# Patient Record
Sex: Male | Born: 1985 | Race: Black or African American | Hispanic: No | Marital: Single
Health system: Midwestern US, Community
[De-identification: ages and names within clinical notes are randomized; demographics above are authoritative.]

## PROBLEM LIST (undated history)

## (undated) DIAGNOSIS — M7989 Other specified soft tissue disorders: Secondary | ICD-10-CM

## (undated) DIAGNOSIS — Z8711 Personal history of peptic ulcer disease: Secondary | ICD-10-CM

## (undated) DIAGNOSIS — Z8719 Personal history of other diseases of the digestive system: Secondary | ICD-10-CM

## (undated) HISTORY — PX: ESOPHAGOGASTRODUODENOSCOPY: SHX1529

---

## 2016-07-18 ENCOUNTER — Emergency Department: Admit: 2016-07-18 | Payer: MEDICAID

## 2016-07-18 ENCOUNTER — Inpatient Hospital Stay
Admit: 2016-07-18 | Discharge: 2016-07-19 | Disposition: A | Discharge status: Home / Self Care | Payer: MEDICAID | Attending: Emergency Medicine

## 2016-07-18 ENCOUNTER — Emergency Department

## 2016-07-18 DIAGNOSIS — M7989 Other specified soft tissue disorders: Secondary | ICD-10-CM

## 2016-07-18 LAB — CBC WITH AUTOMATED DIFF
ABS. BASOPHILS: 0 10*3/uL (ref 0.0–0.1)
ABS. EOSINOPHILS: 0.1 10*3/uL (ref 0.0–0.4)
ABS. LYMPHOCYTES: 3.6 10*3/uL — ABNORMAL HIGH (ref 0.8–3.5)
ABS. MONOCYTES: 1 10*3/uL (ref 0.0–1.0)
ABS. NEUTROPHILS: 4.5 10*3/uL (ref 1.8–8.0)
BASOPHILS: 0 % (ref 0–1)
EOSINOPHILS: 2 % (ref 0–7)
HCT: 39.6 % (ref 36.6–50.3)
HGB: 13 g/dL (ref 12.1–17.0)
LYMPHOCYTES: 39 % (ref 12–49)
MCH: 29.5 PG (ref 26.0–34.0)
MCHC: 32.8 g/dL (ref 30.0–36.5)
MCV: 89.8 FL (ref 80.0–99.0)
MONOCYTES: 10 % (ref 5–13)
NEUTROPHILS: 49 % (ref 32–75)
PLATELET: 227 10*3/uL (ref 150–400)
RBC: 4.41 M/uL (ref 4.10–5.70)
RDW: 14 % (ref 11.5–14.5)
WBC: 9.3 10*3/uL (ref 4.1–11.1)

## 2016-07-18 MED ORDER — KETOROLAC TROMETHAMINE 30 MG/ML INJECTION
30 mg/mL (1 mL) | INTRAMUSCULAR | Status: AC
Start: 2016-07-18 — End: 2016-07-18
  Administered 2016-07-18: via INTRAVENOUS

## 2016-07-18 MED FILL — KETOROLAC TROMETHAMINE 30 MG/ML INJECTION: 30 mg/mL (1 mL) | INTRAMUSCULAR | Qty: 1

## 2016-07-18 NOTE — ED Provider Notes (Signed)
HPI Comments: 30 yo AAM with medical hx remarkable for GERD presenting ambulatory to the ED with complaint of aching, 10/10, bilateral leg pain, worse with ambulating for past two months. Today noticed swelling and pain along the calf after removing socks. Pain and swelling worse in left leg. No interventions PTA. Reports episodic chest pain described as soreness, lower back pain, and hip pain. No fever, headache, chills, ear pain, sore throat, cough, SOB, diarrhea, constipation, dysuria, urgency, hematuria or urinary retention. Drives for several hours daily.     Patient is a 30 y.o. male presenting with lower extremity edema. The history is provided by the patient.   Leg Swelling    Pertinent negatives include no numbness.        Past Medical History:   Diagnosis Date   ??? GERD (gastroesophageal reflux disease)        Past Surgical History:   Procedure Laterality Date   ??? HX ENDOSCOPY           History reviewed. No pertinent family history.    Social History     Social History   ??? Marital status: SINGLE     Spouse name: N/A   ??? Number of children: N/A   ??? Years of education: N/A     Occupational History   ??? Not on file.     Social History Main Topics   ??? Smoking status: Current Some Day Smoker   ??? Smokeless tobacco: Never Used   ??? Alcohol use Yes   ??? Drug use: Yes     Special: Marijuana   ??? Sexual activity: Not on file     Other Topics Concern   ??? Not on file     Social History Narrative   ??? No narrative on file         ALLERGIES: Review of patient's allergies indicates no known allergies.    Review of Systems   Constitutional: Negative.  Negative for chills and fever.   HENT: Negative for congestion, ear pain, rhinorrhea, sore throat and voice change.    Eyes: Negative.  Negative for photophobia, pain and itching.   Respiratory: Negative for cough, chest tightness and shortness of breath.    Cardiovascular: Positive for chest pain. Negative for palpitations.    Gastrointestinal: Negative for abdominal distention, abdominal pain, constipation, diarrhea and vomiting.   Genitourinary: Negative for difficulty urinating, dysuria, frequency and urgency.   Musculoskeletal: Positive for arthralgias and joint swelling. Negative for neck stiffness.   Neurological: Negative for weakness, numbness and headaches.   Psychiatric/Behavioral: Negative for confusion and decreased concentration.   All other systems reviewed and are negative.      Vitals:    07/18/16 1820   BP: 142/89   Pulse: 97   Resp: 16   Temp: 99.3 ??F (37.4 ??C)   SpO2: 99%   Weight: 113.9 kg (251 lb)   Height: 6\' 2"  (1.88 m)            Physical Exam   Constitutional: He is oriented to person, place, and time. He appears well-developed and well-nourished. No distress.   Well appearing AAM seated in NAD   HENT:   Head: Normocephalic and atraumatic.   Right Ear: External ear normal.   Left Ear: External ear normal.   Nose: Nose normal.   Mouth/Throat: Oropharynx is clear and moist. No oropharyngeal exudate.   Eyes: Conjunctivae and EOM are normal. Pupils are equal, round, and reactive to light. Right eye exhibits no discharge.  Left eye exhibits no discharge.   Neck: Normal range of motion. Neck supple.   Cardiovascular: Normal rate, regular rhythm and normal heart sounds.    Pulmonary/Chest: Effort normal and breath sounds normal. He has no wheezes. He has no rales.   Abdominal: Soft. Bowel sounds are normal. He exhibits no distension. There is no tenderness. There is no guarding.   Musculoskeletal: Normal range of motion.        Right lower leg: Normal.        Left lower leg: Normal.        Legs:  Lymphadenopathy:     He has no cervical adenopathy.   Neurological: He is alert and oriented to person, place, and time. No cranial nerve deficit.   Skin: Skin is warm and dry. He is not diaphoretic.   Psychiatric: He has a normal mood and affect. His behavior is normal.   Nursing note and vitals reviewed.       MDM   Number of Diagnoses or Management Options  Diagnosis management comments: 30 yo AAM with complaint of bilateral leg pain x months and subjective LE edema for the past day.  ? DVT vs CHF (doubt) vs renal impairment vs venous insuf amongst others    Plan  EKG  Trop  CBC  CMP  UA      ED Course       Procedures     Progress note      EKG interpretation: (Preliminary)  Rhythm: normal sinus rhythm; and regular . Rate (approx.): 80; Axis: normal; P wave: normal; QRS interval: normal ; ST/T wave: normal; in  Leads. Prudence Heiny C [place], [place]    Labs reviewed. D dimer -. No concern for DVT. Normal renal function without protein to suggest nephrotic syndrome. LFT's normal. Suspect overuse syndrome. Emelynn Rance C [place], [place]    Patient's results have been reviewed with them.  Patient and/or family have verbally conveyed their understanding and agreement of the patient's signs, symptoms, diagnosis, treatment and prognosis and additionally agree to follow up as recommended or return to the Emergency Room should their condition change prior to follow-up.  Discharge instructions have also been provided to the patient with some educational information regarding their diagnosis as well a list of reasons why they would want to return to the ER prior to their follow-up appointment should their condition change. Mansa Willers C [place], [place]    A/P  Leg pain: Follow-up with regular doctor. Naprosyn twice daily for pain. Return for any new or owrsening. Kizzy Olafson C [place], [place]

## 2016-07-18 NOTE — ED Triage Notes (Signed)
C/o bilateral lower leg swelling x 1 week.  Also c/o mid/left chest soreness x 3 days.  Denies SOB.  +THC odor

## 2016-07-18 NOTE — ED Notes (Signed)
Pt given discharge instructions. Questions answered and pt states understanding, no distress noted, pt ambulated out of unit.

## 2016-07-18 NOTE — ED Notes (Signed)
Pt giggling in triage and having difficulty focusing on questions asked. Ambulatory with steady gait.

## 2016-07-19 LAB — URINALYSIS W/ RFLX MICROSCOPIC
Bilirubin: NEGATIVE
Blood: NEGATIVE
Glucose: NEGATIVE mg/dL
Ketone: NEGATIVE mg/dL
Nitrites: NEGATIVE
Protein: NEGATIVE mg/dL
Specific gravity: 1.023 (ref 1.003–1.030)
Urobilinogen: 0.2 EU/dL (ref 0.2–1.0)
pH (UA): 7.5 (ref 5.0–8.0)

## 2016-07-19 LAB — METABOLIC PANEL, COMPREHENSIVE
A-G Ratio: 1.1 (ref 1.1–2.2)
ALT (SGPT): 31 U/L (ref 12–78)
AST (SGOT): 22 U/L (ref 15–37)
Albumin: 4.1 g/dL (ref 3.5–5.0)
Alk. phosphatase: 72 U/L (ref 45–117)
Anion gap: 7 mmol/L (ref 5–15)
BUN/Creatinine ratio: 10 — ABNORMAL LOW (ref 12–20)
BUN: 14 MG/DL (ref 6–20)
Bilirubin, total: 0.3 MG/DL (ref 0.2–1.0)
CO2: 29 mmol/L (ref 21–32)
Calcium: 8.6 MG/DL (ref 8.5–10.1)
Chloride: 104 mmol/L (ref 97–108)
Creatinine: 1.37 MG/DL — ABNORMAL HIGH (ref 0.70–1.30)
GFR est AA: >60 mL/min/{1.73_m2} (ref >60)
GFR est non-AA: >60 mL/min/{1.73_m2} (ref >60)
Globulin: 3.8 g/dL (ref 2.0–4.0)
Glucose: 107 mg/dL — ABNORMAL HIGH (ref 65–100)
Potassium: 4.2 mmol/L (ref 3.5–5.1)
Protein, total: 7.9 g/dL (ref 6.4–8.2)
Sodium: 140 mmol/L (ref 136–145)

## 2016-07-19 LAB — EKG, 12 LEAD, INITIAL
Atrial Rate: 81 {beats}/min
Calculated P Axis: 81 degrees
Calculated R Axis: 31 degrees
Calculated T Axis: 21 degrees
Diagnosis: NORMAL
P-R Interval: 146 ms
Q-T Interval: 348 ms
QRS Duration: 96 ms
QTC Calculation (Bezet): 404 ms
Ventricular Rate: 81 {beats}/min

## 2016-07-19 LAB — TROPONIN I: Troponin-I, Qt.: <0.04 ng/mL (ref <0.05)

## 2016-07-19 LAB — D DIMER: D-dimer: <0.17 mg/L FEU (ref 0.00–0.65)

## 2016-07-19 LAB — EKG 12-LEAD
Atrial Rate: 81 {beats}/min
Diagnosis: NORMAL
P Axis: 81 degrees
P-R Interval: 146 ms
Q-T Interval: 348 ms
QRS Duration: 96 ms
QTc Calculation (Bazett): 404 ms
R Axis: 31 degrees
T Axis: 21 degrees
Ventricular Rate: 81 {beats}/min

## 2016-07-19 LAB — D-DIMER, QUANTITATIVE: D-Dimer, Quant: <0.17 mg/L FEU (ref 0.00–0.65)

## 2016-07-19 MED ORDER — NAPROXEN 500 MG TAB
500 mg | ORAL_TABLET | Freq: Two times a day (BID) | ORAL | 0 refills | Status: AC
Start: 2016-07-19 — End: 2016-07-28

## 2017-11-12 ENCOUNTER — Emergency Department (HOSPITAL_COMMUNITY)
Admission: EM | Admit: 2017-11-12 | Discharge: 2017-11-12 | Disposition: A | Discharge status: Home / Self Care | Payer: Self-pay | Attending: Emergency Medicine | Admitting: Emergency Medicine

## 2017-11-12 ENCOUNTER — Encounter (HOSPITAL_COMMUNITY): Payer: Self-pay

## 2017-11-12 DIAGNOSIS — J029 Acute pharyngitis, unspecified: Secondary | ICD-10-CM | POA: Insufficient documentation

## 2017-11-12 DIAGNOSIS — F17200 Nicotine dependence, unspecified, uncomplicated: Secondary | ICD-10-CM | POA: Insufficient documentation

## 2017-11-12 DIAGNOSIS — R61 Generalized hyperhidrosis: Secondary | ICD-10-CM | POA: Insufficient documentation

## 2017-11-12 DIAGNOSIS — R1110 Vomiting, unspecified: Secondary | ICD-10-CM | POA: Insufficient documentation

## 2017-11-12 HISTORY — DX: Personal history of peptic ulcer disease: Z87.11

## 2017-11-12 HISTORY — DX: Personal history of other diseases of the digestive system: Z87.19

## 2017-11-12 LAB — RAPID STREP SCREEN (MED CTR MEBANE ONLY): STREPTOCOCCUS, GROUP A SCREEN (DIRECT): NEGATIVE

## 2017-11-12 MED ORDER — DEXAMETHASONE SODIUM PHOSPHATE 10 MG/ML IJ SOLN
10.0000 mg | Freq: Once | INTRAMUSCULAR | Status: AC
  Administered 2017-11-12: 10 mg via INTRAMUSCULAR
  Filled 2017-11-12: qty 1

## 2017-11-12 MED ORDER — PHENOL 1.4 % MT LIQD: 1.0000 | OROMUCOSAL | 0 refills | Status: AC | PRN

## 2017-11-12 NOTE — ED Triage Notes (Signed)
Pt states that he has had a sore throat since yesterday and his neck hurts, reports swollen lymph nodes. Denies fever

## 2017-11-12 NOTE — ED Provider Notes (Signed)
[facility] EMERGENCY DEPARTMENT Provider Note   CSN: [id number] Arrival date & time: 11/12/17  2022     History   Chief Complaint Chief Complaint  Patient presents with  . Sore Throat    HPI Isaiah Ball is a 32 y.o. male presenting for evaluation of sore throat.  Patient states that yesterday, he started to develop a sore throat.  He reports associated ear discomfort bilaterally.  He denies fevers, chills, nasal congestion, cough, chest pain, nausea, vomiting, abdominal pain.  He denies trismus.  He reports his voice is more hoarse, but denies it sounding muffled.  He reports increased pain with swallowing, but is able to handle secretions without difficulty.  He denies sick contacts.  He states he has 2 school-aged children, 1 of which had strep last month, but not within the past week.  He has not tried anything for his symptoms.  Pain is bilateral.  He has no other medical problems, does not take medications daily.  HPI  Past Medical History:  Diagnosis Date  . History of stomach ulcers     There are no active problems to display for this patient.   History reviewed. No pertinent surgical history.     Home Medications    Prior to Admission medications   Medication Sig Start Date End Date Taking? Authorizing Provider  phenol (CHLORASEPTIC) 1.4 % LIQD Use as directed 1 spray in the mouth or throat as needed for throat irritation / pain. 11/12/17   Honey Zakarian, PA-C    Family History No family history on file.  Social History Social History   Tobacco Use  . Smoking status: Current Every Day Smoker  . Smokeless tobacco: Never Used  Substance Use Topics  . Alcohol use: No    Frequency: Never  . Drug use: No     Allergies   Patient has no known allergies.   Review of Systems Review of Systems  Constitutional: Negative for chills and fever.  HENT: Positive for sore throat. Negative for congestion and rhinorrhea.       Physical Exam Updated Vital Signs BP 123/67 (BP Location: Right Arm)   Pulse 98   Temp 98.8 F (37.1 C) (Oral)   Resp 18   Ht 6\' 2"  (1.88 m)   Wt 108.9 kg (240 lb)   SpO2 99%   BMI 30.81 kg/m   Physical Exam  Constitutional: He is oriented to person, place, and time. He appears well-developed and well-nourished. No distress.  HENT:  Head: Normocephalic and atraumatic.  Right Ear: Tympanic membrane, external ear and ear canal normal.  Left Ear: Tympanic membrane, external ear and ear canal normal.  Nose: Nose normal. Right sinus exhibits no maxillary sinus tenderness and no frontal sinus tenderness. Left sinus exhibits no maxillary sinus tenderness and no frontal sinus tenderness.  Mouth/Throat: Uvula is midline and mucous membranes are normal. Posterior oropharyngeal erythema present. No oropharyngeal exudate, posterior oropharyngeal edema or tonsillar abscesses. Tonsils are 1+ on the right. Tonsils are 1+ on the left. No tonsillar exudate.  OP erythematous with mild bilateral tonsillar swelling.  No exudate noted.  No trismus.  Uvula midline with equal palate rise.  No obvious abscess.  Handling secretions easily.  Eyes: Conjunctivae and EOM are normal. Pupils are equal, round, and reactive to light.  Neck: Normal range of motion.  Cardiovascular: Normal rate, regular rhythm and intact distal pulses.  Pulmonary/Chest: Effort normal and breath sounds normal. He has no decreased breath sounds. He  has no wheezes. He has no rhonchi. He has no rales.  Pt speaking in full sentences without difficulty. Clear lung sounds in all fields  Abdominal: Soft. He exhibits no distension. There is no tenderness.  Musculoskeletal: Normal range of motion.  Lymphadenopathy:    He has cervical adenopathy.  Neurological: He is alert and oriented to person, place, and time.  Skin: Skin is warm.  Psychiatric: He has a normal mood and affect.  Nursing note and vitals reviewed.    ED Treatments /  Results  Labs (all labs ordered are listed, but only abnormal results are displayed) Labs Reviewed  RAPID STREP SCREEN (NOT AT [facility])  CULTURE, GROUP A STREP [facility])    EKG  EKG Interpretation None       Radiology No results found.  Procedures Procedures (including critical care time)  Medications Ordered in ED Medications  dexamethasone (DECADRON) injection 10 mg (10 mg Intramuscular Given 11/12/17 2102)     Initial Impression / Assessment and Plan / ED Course  I have reviewed the triage vital signs and the nursing notes.  Pertinent labs & imaging results that were available during my care of the patient were reviewed by me and considered in my medical decision making (see chart for details).     Pt presenting for evaluation of sore throat.  Physical exam reassuring, he is afebrile and appears nontoxic.  Initially tachycardic, but this improved in the ER without intervention.  Strep pending.  Will give Decadron to help with tonsillar swelling and pain while strep is pending.  Patient became diaphoretic and had an episode of emesis after the Decadron shot.  Reports it hurt a lot, ?if pain is source of reaction.  Patient appears stable and in no acute distress.  Strep test negative.  On reassessment, patient reports he is without diaphoresis, nausea, vomiting, or worsening throat sxs. No distress noted. Likely viral illness as cause of pharyngitis.  Discussed culture is pending.  Will treat symptomatically.  Return precautions given.  At this time, patient appears safe for discharge.  Patient states he understands and agrees to plan.   Final Clinical Impressions(s) / ED Diagnoses   Final diagnoses:  Pharyngitis, unspecified etiology    ED Discharge Orders        Ordered    phenol (CHLORASEPTIC) 1.4 % LIQD  As needed     11/12/17 2230       Alveria ApleyCaccavale, Kay Shippy, PA-C 11/13/17 0124    Mancel BaleWentz, Elliott, MD 11/13/17 1200

## 2017-11-12 NOTE — Discharge Instructions (Signed)
You likely have a viral illness.  This should be treated symptomatically. °Use Tylenol or ibuprofen as needed for fevers or body aches. °Use sore throat spray as needed.  °Make sure you stay well-hydrated with water. °Wash your hands frequently to prevent spread of infection. °Follow-up with your primary care doctor in 1 week if your symptoms are not improving. °Return to the emergency room if you develop chest pain, difficulty breathing, or any new or worsening symptoms. ° °

## 2017-11-12 NOTE — ED Notes (Signed)
Pt. Family came out of room and said pt. Was vomiting. Pt. Was found profusely sweating and actively vomiting. Vital signs rechecked and within normal range. MD made aware and assessed pt. Pt. Given a cold wet washcloth. NAD at this time.

## 2017-11-15 LAB — CULTURE, GROUP A STREP (THRC)

## 2017-11-16 ENCOUNTER — Telehealth: Payer: Self-pay

## 2017-11-16 NOTE — Telephone Encounter (Signed)
Post ED Visit - Positive Culture Follow-up: Successful Patient Follow-Up  Culture assessed and recommendations reviewed by: []  Enzo BiNathan Batchelder, Pharm.D. []  Celedonio MiyamotoJeremy Frens, [address]Ph[address] BCPS AQ-ID [x]  Garvin FilaMike Maccia, Pharm.D., BCPS []  Georgina PillionElizabeth Martin, Pharm.D., BCPS []  VenedociaMinh Pham, [address]Pharm[address]D., BCPS, AAHIVP []  Estella HuskMichelle Turner, Pharm.D., BCPS, AAHIVP []  Lysle Pearlachel Rumbarger, PharmD, BCPS []  Casilda Carlsaylor Stone, PharmD, BCPS []  Pollyann SamplesAndy Johnston, PharmD, BCPS  Positive strep culture  [x]  Patient discharged without antimicrobial prescription and treatment is now indicated []  Organism is resistant to prescribed ED discharge antimicrobial []  Patient with positive blood cultures  Changes discussed with ED provider: Lou MinerSmanatha Pertrucelli PA New antibiotic prescription amoxicillian 500 mg BID x 7 days [facility] [address] [phone]  Contacted patient, date 11/16/17, time 1303   Pamula Luther, Linnell FullingRose Burnett 11/16/2017, 1:01 PM

## 2017-11-16 NOTE — Progress Notes (Signed)
ED Antimicrobial Stewardship Positive Culture Follow Up   Isaiah Ball is an 32 y.o. male who [facility] with a chief complaint of  Chief Complaint  Patient presents with  . Sore Throat    Recent Results (from the past 720 hour(s))  Rapid strep screen     Status: None   Collection Time: 11/12/17  8:40 PM  Result Value Ref Range Status   Streptococcus, Group A Screen (Direct) NEGATIVE NEGATIVE Final    Comment: (NOTE) A Rapid Antigen test may result negative if the antigen level in the sample is below the detection level of this test. The FDA has not cleared this test as a stand-alone test therefore the rapid antigen negative result has reflexed to a Group A Strep culture.   Culture, group A strep     Status: None   Collection Time: 11/12/17  8:40 PM  Result Value Ref Range Status   Specimen Description THROAT  Final   Special Requests NONE Reflexed from [id number]  Final   Culture RARE GROUP A STREP (S.PYOGENES) ISOLATED  Final   Report Status 11/15/2017 FINAL  Final   [x]  Patient discharged originally without antimicrobial agent and treatment is now indicated  New antibiotic prescription: amoxicillin 500 mg bid x 10 d  ED Provider: Kathrine HaddockSam Petrucelli, PA-c  Bertram MillardMichael A [name] 11/16/2017, 8:18 AM Infectious Diseases Pharmacist Phone# [phone]'

## 2018-04-17 ENCOUNTER — Encounter (HOSPITAL_COMMUNITY): Payer: Self-pay

## 2018-04-17 ENCOUNTER — Emergency Department (HOSPITAL_COMMUNITY): Payer: Self-pay

## 2018-04-17 ENCOUNTER — Other Ambulatory Visit: Payer: Self-pay

## 2018-04-17 ENCOUNTER — Emergency Department (HOSPITAL_COMMUNITY)
Admission: EM | Admit: 2018-04-17 | Discharge: 2018-04-17 | Disposition: A | Discharge status: Home / Self Care | Payer: Self-pay | Attending: Emergency Medicine | Admitting: Emergency Medicine

## 2018-04-17 DIAGNOSIS — M25512 Pain in left shoulder: Secondary | ICD-10-CM

## 2018-04-17 DIAGNOSIS — Z87891 Personal history of nicotine dependence: Secondary | ICD-10-CM | POA: Insufficient documentation

## 2018-04-17 DIAGNOSIS — M79651 Pain in right thigh: Secondary | ICD-10-CM | POA: Insufficient documentation

## 2018-04-17 DIAGNOSIS — M25511 Pain in right shoulder: Secondary | ICD-10-CM

## 2018-04-17 DIAGNOSIS — Z79899 Other long term (current) drug therapy: Secondary | ICD-10-CM | POA: Insufficient documentation

## 2018-04-17 DIAGNOSIS — Y939 Activity, unspecified: Secondary | ICD-10-CM | POA: Insufficient documentation

## 2018-04-17 DIAGNOSIS — S161XXA Strain of muscle, fascia and tendon at neck level, initial encounter: Secondary | ICD-10-CM | POA: Insufficient documentation

## 2018-04-17 DIAGNOSIS — Y999 Unspecified external cause status: Secondary | ICD-10-CM | POA: Insufficient documentation

## 2018-04-17 DIAGNOSIS — Y92410 Unspecified street and highway as the place of occurrence of the external cause: Secondary | ICD-10-CM | POA: Insufficient documentation

## 2018-04-17 MED ORDER — IBUPROFEN 800 MG PO TABS: 800.0000 mg | ORAL_TABLET | Freq: Three times a day (TID) | ORAL | 0 refills | Status: AC | PRN

## 2018-04-17 MED ORDER — CYCLOBENZAPRINE HCL 10 MG PO TABS: 10.0000 mg | ORAL_TABLET | Freq: Two times a day (BID) | ORAL | 0 refills | Status: AC | PRN

## 2018-04-17 MED ORDER — IBUPROFEN 200 MG PO TABS
600.0000 mg | ORAL_TABLET | Freq: Once | ORAL | Status: AC
  Administered 2018-04-17: 600 mg via ORAL
  Filled 2018-04-17: qty 3
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
## 2018-07-12 ENCOUNTER — Emergency Department (HOSPITAL_COMMUNITY)
Admission: EM | Admit: 2018-07-12 | Discharge: 2018-07-12 | Disposition: A | Discharge status: Home / Self Care | Payer: Self-pay | Attending: Emergency Medicine | Admitting: Emergency Medicine

## 2018-07-12 ENCOUNTER — Emergency Department (HOSPITAL_COMMUNITY): Payer: Self-pay

## 2018-07-12 ENCOUNTER — Encounter (HOSPITAL_COMMUNITY): Payer: Self-pay | Admitting: Emergency Medicine

## 2018-07-12 DIAGNOSIS — Z87891 Personal history of nicotine dependence: Secondary | ICD-10-CM | POA: Insufficient documentation

## 2018-07-12 DIAGNOSIS — R079 Chest pain, unspecified: Secondary | ICD-10-CM

## 2018-07-12 LAB — BASIC METABOLIC PANEL
ANION GAP: 10 (ref 5–15)
BUN: 10 mg/dL (ref 6–20)
CALCIUM: 9.4 mg/dL (ref 8.9–10.3)
CHLORIDE: 107 mmol/L (ref 98–111)
CO2: 23 mmol/L (ref 22–32)
Creatinine, Ser: 1.13 mg/dL (ref 0.61–1.24)
GFR calc non Af Amer: >60 mL/min (ref >60)
Glucose, Bld: 109 mg/dL — ABNORMAL HIGH (ref 70–99)
Potassium: 3.9 mmol/L (ref 3.5–5.1)
Sodium: 140 mmol/L (ref 135–145)

## 2018-07-12 LAB — I-STAT TROPONIN, ED
TROPONIN I, POC: 0 ng/mL (ref 0.00–0.08)
Troponin i, poc: 0 ng/mL (ref 0.00–0.08)

## 2018-07-12 LAB — CBC
HCT: 42.8 % (ref 39.0–52.0)
HEMOGLOBIN: 13.7 g/dL (ref 13.0–17.0)
MCH: 29.3 pg (ref 26.0–34.0)
MCHC: 32 g/dL (ref 30.0–36.0)
MCV: 91.6 fL (ref 78.0–100.0)
Platelets: 264 10*3/uL (ref 150–400)
RBC: 4.67 MIL/uL (ref 4.22–5.81)
RDW: 13.9 % (ref 11.5–15.5)
WBC: 11.1 10*3/uL — ABNORMAL HIGH (ref 4.0–10.5)

## 2018-07-12 LAB — HEPATIC FUNCTION PANEL
ALT: 19 U/L (ref 0–44)
AST: 24 U/L (ref 15–41)
Albumin: 4 g/dL (ref 3.5–5.0)
Alkaline Phosphatase: 69 U/L (ref 38–126)
BILIRUBIN DIRECT: 0.2 mg/dL (ref 0.0–0.2)
Indirect Bilirubin: 0.7 mg/dL (ref 0.3–0.9)
Total Bilirubin: 0.9 mg/dL (ref 0.3–1.2)
Total Protein: 7.1 g/dL (ref 6.5–8.1)

## 2018-07-12 LAB — LIPASE, BLOOD: LIPASE: 39 U/L (ref 11–51)

## 2018-07-12 MED ORDER — KETOROLAC TROMETHAMINE 60 MG/2ML IM SOLN
30.0000 mg | Freq: Once | INTRAMUSCULAR | Status: AC
  Administered 2018-07-12: 30 mg via INTRAMUSCULAR
  Filled 2018-07-12: qty 2

## 2018-07-12 MED ORDER — NAPROXEN 500 MG PO TABS: 500.0000 mg | ORAL_TABLET | Freq: Two times a day (BID) | ORAL | 0 refills | Status: DC | PRN

## 2018-07-12 MED ORDER — GI COCKTAIL ~~LOC~~
30.0000 mL | Freq: Once | ORAL | Status: AC
  Administered 2018-07-12: 30 mL via ORAL
  Filled 2018-07-12: qty 30

## 2018-07-12 NOTE — ED Triage Notes (Signed)
Pt to ER for evaluation of substernal sharp chest pain onset today 1 hour PTA. Reports initially thought it was gas. Not relieved from any and deep breathing provokes it. Reports hx of gastric ulcers. VSS at this time.

## 2018-07-12 NOTE — ED Provider Notes (Signed)
[facility] EMERGENCY DEPARTMENT Provider Note   CSN: [id number] Arrival date & time: 07/12/18  1259     History   Chief Complaint Chief Complaint  Patient presents with  . Chest Pain    HPI Isaiah Ball is a 32 y.o. male.  The history is provided by the patient and medical records. No language interpreter was used.   Isaiah Ball is a 32 y.o. male  with a PMH of gastric ulcers who presents to the Emergency Department complaining of sharp substernal chest pain which began around 11am this morning. Patient reports having small, mild episodes of similar chest pains over the last few months or so. Today, pain was much more severe than usual. Pain will wax-and-wane, but has not subsided since onset. Will get worse with deep breathing. Denies any alleviating factors. No medications taken prior to arrival for symptoms.    Past Medical History:  Diagnosis Date  . History of stomach ulcers     There are no active problems to display for this patient.   Past Surgical History:  Procedure Laterality Date  . ESOPHAGOGASTRODUODENOSCOPY          Home Medications    Prior to Admission medications   Medication Sig Start Date End Date Taking? Authorizing Provider  cyclobenzaprine (FLEXERIL) 10 MG tablet Take 1 tablet (10 mg total) by mouth 2 (two) times daily as needed for muscle spasms. 04/17/18   Terrilee Files, MD  ibuprofen (ADVIL,MOTRIN) 800 MG tablet Take 1 tablet (800 mg total) by mouth every 8 (eight) hours as needed. 04/17/18   Terrilee Files, MD  naproxen (NAPROSYN) 500 MG tablet Take 1 tablet (500 mg total) by mouth 2 (two) times daily as needed for mild pain or moderate pain. 07/12/18   Idamae Coccia, Chase Picket, PA-C  phenol (CHLORASEPTIC) 1.4 % LIQD Use as directed 1 spray in the mouth or throat as needed for throat irritation / pain. Patient not taking: Reported on 04/17/2018 11/12/17   Alveria Apley, PA-C    Family History Family History    Problem Relation Age of Onset  . Heart failure Mother   . Anxiety disorder Mother     Social History Social History   Tobacco Use  . Smoking status: Former Games developer  . Smokeless tobacco: Never Used  Substance Use Topics  . Alcohol use: No    Frequency: Never  . Drug use: No     Allergies   Tylenol [acetaminophen]   Review of Systems Review of Systems  Cardiovascular: Positive for chest pain. Negative for palpitations and leg swelling.  All other systems reviewed and are negative.    Physical Exam Updated Vital Signs BP 124/72   Pulse 79   Temp 98.2 F (36.8 C) (Oral)   Resp 16   SpO2 99%   Physical Exam  Constitutional: He is oriented to person, place, and time. He appears well-developed and well-nourished. No distress.  HENT:  Head: Normocephalic and atraumatic.  Cardiovascular: Normal rate, regular rhythm and normal heart sounds.  No murmur heard. Pulmonary/Chest: Effort normal and breath sounds normal. No respiratory distress. He has no wheezes. He has no rales.  Abdominal: Soft. He exhibits no distension. There is tenderness.  Tenderness to palpation to epigastrium. No rebound or guarding. Negative Murphy's.   Musculoskeletal: He exhibits no edema.  Neurological: He is alert and oriented to person, place, and time.  Skin: Skin is warm and dry.  Nursing note and vitals reviewed.  ED Treatments / Results  Labs (all labs ordered are listed, but only abnormal results are displayed) Labs Reviewed  BASIC METABOLIC PANEL - Abnormal; Notable for the following components:      Result Value   Glucose, Bld 109 (*)    All other components within normal limits  CBC - Abnormal; Notable for the following components:   WBC 11.1 (*)    All other components within normal limits  LIPASE, BLOOD  HEPATIC FUNCTION PANEL  I-STAT TROPONIN, ED  I-STAT TROPONIN, ED    EKG EKG Interpretation  Date/Time:  Friday July 12 2018 13:03:36 EDT Ventricular Rate:   69 PR Interval:  154 QRS Duration: 92 QT Interval:  386 QTC Calculation: 413 R Axis:   66 Text Interpretation:  Normal sinus rhythm Cannot rule out Anterior infarct , age undetermined Abnormal ECG Confirmed by Benjiman Core [phone]) on 07/12/2018 7:31:19 PM   Radiology Dg Chest 2 View  Result Date: 07/12/2018 CLINICAL DATA:  32 year old male with a history substernal chest pain EXAM: CHEST - 2 VIEW COMPARISON:  None. FINDINGS: The heart size and mediastinal contours are within normal limits. Both lungs are clear. The visualized skeletal structures are unremarkable. IMPRESSION: Negative for acute cardiopulmonary disease Electronically Signed   By: Gilmer Mor D.O.   On: 07/12/2018 14:03    Procedures Procedures (including critical care time)  Medications Ordered in ED Medications  gi cocktail (Maalox,Lidocaine,Donnatal) (30 mLs Oral Given 07/12/18 1858)  ketorolac (TORADOL) injection 30 mg (30 mg Intramuscular Given 07/12/18 1900)     Initial Impression / Assessment and Plan / ED Course  I have reviewed the triage vital signs and the nursing notes.  Pertinent labs & imaging results that were available during my care of the patient were reviewed by me and considered in my medical decision making (see chart for details).    Isaiah Ball is a 32 y.o. male who presents to ED for sharp, central chest pain which began this afternoon. History of similar pains over the last month or two which were less severe and resolved without intervention. On exam, patient with normal cardiopulmonary exam. He does have some epigastric tenderness, but no rebound or guarding. Negative Murphy's.    Labs reviewed and reassuring with negative troponin x2.  CXR with no acute abnormalities.  EKG non-ischemic.   PERC negative. Doubt PE. Low risk heart score of 2.  Patient feels improved after GI cocktail and toradol. Chest pain resolved. Repeat abdominal exam reassuring.   Patient's symptoms  unlikely to be of cardiac etiology. Labs and imaging reviewed again prior to discharge. Patient has been advised to return to the ED if development of any exertional chest pain, trouble breathing, new/worsening symptoms or for any additional concerns. Evaluation does not show pathology that would require ongoing emergent intervention or inpatient treatment. Encouraged to follow up with PCP. Patient understands return precautions and follow up plan. All questions answered.   Final Clinical Impressions(s) / ED Diagnoses   Final diagnoses:  Chest pain with low risk for cardiac etiology    ED Discharge Orders         Ordered    naproxen (NAPROSYN) 500 MG tablet  2 times daily PRN     07/12/18 1931           Ernestene Coover, Chase Picket, PA-C 07/12/18 1936    Benjiman Core, MD 07/13/18 [phone]

## 2018-07-12 NOTE — Discharge Instructions (Signed)
It was my pleasure taking care of you today!  ° °You were seen in the Emergency Department today for chest pain.  As we have discussed, today’s blood work and imaging are normal, but you may require further testing. ° °Please call your primary care physician to schedule a follow up appointment to discuss your ER visit today.  ° °Return to the Emergency Department if you experience any further chest pain/pressure/tightness, difficulty breathing, sudden sweating, or other symptoms that concern you. °

## 2019-12-18 ENCOUNTER — Other Ambulatory Visit: Payer: Self-pay

## 2019-12-18 ENCOUNTER — Emergency Department (HOSPITAL_COMMUNITY)
Admission: EM | Admit: 2019-12-18 | Discharge: 2019-12-18 | Disposition: A | Discharge status: Home / Self Care | Payer: Self-pay | Attending: Emergency Medicine | Admitting: Emergency Medicine

## 2019-12-18 DIAGNOSIS — M546 Pain in thoracic spine: Secondary | ICD-10-CM | POA: Insufficient documentation

## 2019-12-18 DIAGNOSIS — G8929 Other chronic pain: Secondary | ICD-10-CM | POA: Insufficient documentation

## 2019-12-18 DIAGNOSIS — M25511 Pain in right shoulder: Secondary | ICD-10-CM | POA: Insufficient documentation

## 2019-12-18 MED ORDER — NAPROXEN 500 MG PO TABS: 500.0000 mg | ORAL_TABLET | Freq: Two times a day (BID) | ORAL | 0 refills | Status: AC

## 2019-12-18 NOTE — ED Notes (Signed)
Patient verbalizes understanding of discharge instructions. Opportunity for questioning and answers were provided. Armband removed by staff, pt discharged from ED ambulatory.   

## 2019-12-18 NOTE — ED Provider Notes (Signed)
[facility] EMERGENCY DEPARTMENT Provider Note   CSN: [id number] Arrival date & time: 12/18/19  1708     History Chief Complaint  Patient presents with  . Back Pain  . Shoulder Pain    Isaiah Ball is a 34 y.o. male.  HPI   34 year old male presents with mid right-sided thoracic back pain and left shoulder pain.  He states he has had this pain intermittently for years.  He notes pain is worsened over the last 2 months since being at work.  He states he works at [employer] and walks around a lot.  This is worsened his pain.  He does note currently all of his pain has resolved.  He denies any current back pain or left shoulder pain.  He denies any numbness, tingling, weakness.  He denies any direct injury or trauma to the back or shoulder in the last 2 months.     Past Medical History:  Diagnosis Date  . History of stomach ulcers     There are no problems to display for this patient.   Past Surgical History:  Procedure Laterality Date  . ESOPHAGOGASTRODUODENOSCOPY         Family History  Problem Relation Age of Onset  . Heart failure Mother   . Anxiety disorder Mother     Social History   Tobacco Use  . Smoking status: Former Games developer  . Smokeless tobacco: Never Used  Substance Use Topics  . Alcohol use: No  . Drug use: No    Home Medications Prior to Admission medications   Medication Sig Start Date End Date Taking? Authorizing Provider  cyclobenzaprine (FLEXERIL) 10 MG tablet Take 1 tablet (10 mg total) by mouth 2 (two) times daily as needed for muscle spasms. 04/17/18   Terrilee Files, MD  ibuprofen (ADVIL,MOTRIN) 800 MG tablet Take 1 tablet (800 mg total) by mouth every 8 (eight) hours as needed. 04/17/18   Terrilee Files, MD  naproxen (NAPROSYN) 500 MG tablet Take 1 tablet (500 mg total) by mouth 2 (two) times daily with a meal. 12/18/19   Kaeley Vinje S, PA-C  phenol (CHLORASEPTIC) 1.4 % LIQD Use as directed 1 spray in the  mouth or throat as needed for throat irritation / pain. Patient not taking: Reported on 04/17/2018 11/12/17   Caccavale, Sophia, PA-C    Allergies    Tylenol [acetaminophen] and Zantac [ranitidine]  Review of Systems   Review of Systems  Constitutional: Negative for chills and fever.  Respiratory: Negative for shortness of breath.   Cardiovascular: Negative for chest pain.  Gastrointestinal: Negative for abdominal pain, nausea and vomiting.  Musculoskeletal: Positive for arthralgias and back pain.  Neurological: Negative for numbness.    Physical Exam Updated Vital Signs BP (!) 148/90   Pulse 75   Temp 98.4 F (36.9 C) (Oral)   Resp 16   SpO2 100%   Physical Exam Vitals and nursing note reviewed.  Constitutional:      Appearance: He is well-developed.  HENT:     Head: Normocephalic and atraumatic.  Eyes:     Conjunctiva/sclera: Conjunctivae normal.  Cardiovascular:     Rate and Rhythm: Normal rate and regular rhythm.     Heart sounds: Normal heart sounds. No murmur.  Pulmonary:     Effort: Pulmonary effort is normal. No respiratory distress.     Breath sounds: Normal breath sounds. No wheezing or rales.  Abdominal:     General: Bowel sounds are normal.  There is no distension.     Palpations: Abdomen is soft.     Tenderness: There is no abdominal tenderness.  Musculoskeletal:        General: No tenderness or deformity. Normal range of motion.     Right shoulder: Normal.     Left shoulder: Normal.     Cervical back: Normal and neck supple.     Thoracic back: Normal. No tenderness.     Lumbar back: Normal.  Skin:    General: Skin is warm and dry.     Findings: No erythema or rash.  Neurological:     Mental Status: He is alert and oriented to person, place, and time.     Motor: Motor function is intact.  Psychiatric:        Behavior: Behavior normal.     ED Results / Procedures / Treatments   Labs (all labs ordered are listed, but only abnormal results are  displayed) Labs Reviewed - No data to display  EKG None  Radiology No results found.  Procedures Procedures (including critical care time)  Medications Ordered in ED Medications - No data to display  ED Course  I have reviewed the triage vital signs and the nursing notes.  Pertinent labs & imaging results that were available during my care of the patient were reviewed by me and considered in my medical decision making (see chart for details).    MDM Rules/Calculators/A&P                      Patient presents with back pain and left shoulder pain.  On physical exam patient has no tenderness over the back or left shoulder.  He has equal strength in bilateral upper extremities 5 out of 5, neurovascularly intact distally with bounding radial pulses.  No palpable deformities of the back or shoulders.  He denies any pain during my evaluation.  Encouraged naproxen as needed for pain, follow-up with Ortho.  No indication for imaging at this time.  History and physical not consistent with fracture, dislocation, neurovascular injury.  Patient ready and stable for discharge.   At this time there does not appear to be any evidence of an acute emergency medical condition and the patient appears stable for discharge with appropriate outpatient follow up.Diagnosis was discussed with patient who verbalizes understanding and is agreeable to discharge.     Final Clinical Impression(s) / ED Diagnoses Final diagnoses:  Chronic right-sided thoracic back pain  Chronic left shoulder pain    Rx / DC Orders ED Discharge Orders         Ordered    naproxen (NAPROSYN) 500 MG tablet  2 times daily with meals     12/18/19 2043           Etter Sjogren, [place] 12/18/19 2221    Tegeler, Gwenyth Allegra, MD 12/19/19 [phone]

## 2019-12-18 NOTE — Discharge Instructions (Addendum)
Follow-up with orthopedics in 1 to 2 weeks for continued evaluation.  Take naproxen as needed for pain.  Return to the emergency room immediately for new or worsening symptoms or concerns, such as new or worsening pain, numbness, weakness or any concerns at all.
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
## 2020-05-17 ENCOUNTER — Emergency Department (HOSPITAL_COMMUNITY)
Admission: EM | Admit: 2020-05-17 | Discharge: 2020-05-18 | Discharge status: Against Medical Advice | Payer: Self-pay | Attending: Emergency Medicine | Admitting: Emergency Medicine

## 2020-05-17 ENCOUNTER — Other Ambulatory Visit: Payer: Self-pay

## 2020-05-17 ENCOUNTER — Encounter (HOSPITAL_COMMUNITY): Payer: Self-pay | Admitting: Emergency Medicine

## 2020-05-17 ENCOUNTER — Emergency Department (HOSPITAL_COMMUNITY): Payer: Self-pay

## 2020-05-17 DIAGNOSIS — Z87891 Personal history of nicotine dependence: Secondary | ICD-10-CM | POA: Insufficient documentation

## 2020-05-17 DIAGNOSIS — R079 Chest pain, unspecified: Secondary | ICD-10-CM | POA: Insufficient documentation

## 2020-05-17 DIAGNOSIS — M791 Myalgia, unspecified site: Secondary | ICD-10-CM | POA: Insufficient documentation

## 2020-05-17 DIAGNOSIS — M2550 Pain in unspecified joint: Secondary | ICD-10-CM | POA: Insufficient documentation

## 2020-05-17 DIAGNOSIS — R42 Dizziness and giddiness: Secondary | ICD-10-CM | POA: Insufficient documentation

## 2020-05-17 DIAGNOSIS — Z20822 Contact with and (suspected) exposure to covid-19: Secondary | ICD-10-CM | POA: Insufficient documentation

## 2020-05-17 MED ORDER — SODIUM CHLORIDE 0.9% FLUSH: 3.0000 mL | Freq: Once | INTRAVENOUS | Status: DC

## 2020-05-17 NOTE — ED Triage Notes (Signed)
Pt c/o intermittent chest pain, shortness of breath, lightheadedness and "sweating" x "a few months".

## 2020-05-18 LAB — BASIC METABOLIC PANEL
Anion gap: 10 (ref 5–15)
BUN: 12 mg/dL (ref 6–20)
CO2: 27 mmol/L (ref 22–32)
Calcium: 9.3 mg/dL (ref 8.9–10.3)
Chloride: 101 mmol/L (ref 98–111)
Creatinine, Ser: 1.15 mg/dL (ref 0.61–1.24)
GFR calc Af Amer: >60 mL/min (ref >60)
GFR calc non Af Amer: >60 mL/min (ref >60)
Glucose, Bld: 100 mg/dL — ABNORMAL HIGH (ref 70–99)
Potassium: 4 mmol/L (ref 3.5–5.1)
Sodium: 138 mmol/L (ref 135–145)

## 2020-05-18 LAB — TROPONIN I (HIGH SENSITIVITY)
Troponin I (High Sensitivity): 5 ng/L (ref <18)
Troponin I (High Sensitivity): 3 ng/L (ref <18)

## 2020-05-18 LAB — CBC
HCT: 39.9 % (ref 39.0–52.0)
Hemoglobin: 13.1 g/dL (ref 13.0–17.0)
MCH: 30 pg (ref 26.0–34.0)
MCHC: 32.8 g/dL (ref 30.0–36.0)
MCV: 91.5 fL (ref 80.0–100.0)
Platelets: 266 10*3/uL (ref 150–400)
RBC: 4.36 MIL/uL (ref 4.22–5.81)
RDW: 13.5 % (ref 11.5–15.5)
WBC: 13.6 10*3/uL — ABNORMAL HIGH (ref 4.0–10.5)
nRBC: 0 % (ref 0.0–0.2)

## 2020-05-18 LAB — SARS CORONAVIRUS 2 BY RT PCR (HOSPITAL ORDER, PERFORMED IN ~~LOC~~ HOSPITAL LAB): SARS Coronavirus 2: NEGATIVE

## 2020-05-18 NOTE — Discharge Instructions (Addendum)
Our case manager will contact you about getting set up with a new PCP, if you are eligible to do so.  If your symptoms worsen, or you become lightheaded or feel like passing out, return to the ER.  Your COVID test should result in the next 24 hours.

## 2020-05-18 NOTE — ED Provider Notes (Signed)
[facility] EMERGENCY DEPARTMENT Provider Note   CSN: [id number] Arrival date & time: 05/17/20  2307     History Chief Complaint  Patient presents with  . Chest Pain  . Dizziness    Isaiah Ball is a 34 y.o. male w/ hx of gastric ulcer presenting to the ED with chest pain and lightheadedness.  Patient reports he has had intermittent chest pain for "several months."  This occur at random, but are sometimes worse with exertion.  He reports these chest pains were more persistent this week.  He also had two episodes of lightheadedness at home, feeling like he was going to pass out.  This has happened in the past as well.  Currently he has some discomfort in his left shoulder, a sharp pain, not worse with movement.  It is moderate intensity.  He does not have a PCP yet due to insurance issue but is recently on [facility]ica[facility].  He reports he smokes cigarettes and marijuana daily.  He denies other recreational drugs, but does drink occasionally (not daily).  His wife reports he "sweats a lot" and he has done so his entire life.  He says it is just the heat outdoors.  He denies any other know medical problems.   No hemoptysis or asymmetric LE edema. Patient denies personal or family history of DVT or PE. No recent hormone use (including OCP); travel for >6 hours; prolonged immobilization for greater than 3 days; surgeries or trauma in the last 4 weeks; or malignancy with treatment within 6 months.  He is not covid vaccinated.  HPI     Past Medical History:  Diagnosis Date  . History of stomach ulcers     There are no problems to display for this patient.   Past Surgical History:  Procedure Laterality Date  . ESOPHAGOGASTRODUODENOSCOPY         Family History  Problem Relation Age of Onset  . Heart failure Mother   . Anxiety disorder Mother     Social History   Tobacco Use  . Smoking status: Former Games developer  . Smokeless tobacco: Never Used  Vaping Use    . Vaping Use: Never used  Substance Use Topics  . Alcohol use: No  . Drug use: No    Home Medications Prior to Admission medications   Medication Sig Start Date End Date Taking? Authorizing Provider  cyclobenzaprine (FLEXERIL) 10 MG tablet Take 1 tablet (10 mg total) by mouth 2 (two) times daily as needed for muscle spasms. 04/17/18   Terrilee Files, MD  ibuprofen (ADVIL,MOTRIN) 800 MG tablet Take 1 tablet (800 mg total) by mouth every 8 (eight) hours as needed. 04/17/18   Terrilee Files, MD  naproxen (NAPROSYN) 500 MG tablet Take 1 tablet (500 mg total) by mouth 2 (two) times daily with a meal. 12/18/19   Kendrick, Caitlyn S, PA-C  phenol (CHLORASEPTIC) 1.4 % LIQD Use as directed 1 spray in the mouth or throat as needed for throat irritation / pain. Patient not taking: Reported on 04/17/2018 11/12/17   Caccavale, Sophia, PA-C    Allergies    Tylenol [acetaminophen] and Zantac [ranitidine]  Review of Systems   Review of Systems  Constitutional: Negative for chills and fever.  HENT: Negative for ear pain and sore throat.   Eyes: Negative for photophobia and visual disturbance.  Respiratory: Negative for cough and shortness of breath.   Cardiovascular: Positive for chest pain. Negative for palpitations.  Gastrointestinal: Negative for abdominal pain  and vomiting.  Genitourinary: Negative for dysuria and hematuria.  Musculoskeletal: Positive for arthralgias and myalgias.  Skin: Negative for color change and rash.  Neurological: Positive for light-headedness. Negative for syncope, facial asymmetry and headaches.  Psychiatric/Behavioral: Negative for agitation and confusion.  All other systems reviewed and are negative.   Physical Exam Updated Vital Signs BP 137/72 (BP Location: Right Arm)   Pulse 71   Temp 98.4 F (36.9 C) (Oral)   Resp 12   SpO2 97%   Physical Exam Vitals and nursing note reviewed.  Constitutional:      Appearance: He is well-developed.  HENT:     Head:  Normocephalic and atraumatic.  Eyes:     Conjunctiva/sclera: Conjunctivae normal.  Cardiovascular:     Rate and Rhythm: Normal rate and regular rhythm.     Heart sounds: Normal heart sounds. No murmur heard.   Pulmonary:     Effort: Pulmonary effort is normal. No respiratory distress.     Breath sounds: Normal breath sounds.  Abdominal:     Palpations: Abdomen is soft.     Tenderness: There is no abdominal tenderness.  Musculoskeletal:     Cervical back: Neck supple.     Right lower leg: No tenderness. No edema.     Left lower leg: No tenderness. No edema.  Skin:    General: Skin is warm and dry.  Neurological:     General: No focal deficit present.     Mental Status: He is alert and oriented to person, place, and time.  Psychiatric:        Mood and Affect: Mood normal.        Behavior: Behavior normal.     ED Results / Procedures / Treatments   Labs (all labs ordered are listed, but only abnormal results are displayed) Labs Reviewed  BASIC METABOLIC PANEL - Abnormal; Notable for the following components:      Result Value   Glucose, Bld 100 (*)    All other components within normal limits  CBC - Abnormal; Notable for the following components:   WBC 13.6 (*)    All other components within normal limits  SARS CORONAVIRUS 2 BY RT PCR (HOSPITAL ORDER, PERFORMED IN [facility])  TROPONIN I (HIGH SENSITIVITY)  TROPONIN I (HIGH SENSITIVITY)    EKG EKG Interpretation  Date/Time:  Monday May 17 2020 23:15:23 EDT Ventricular Rate:  81 PR Interval:  150 QRS Duration: 96 QT Interval:  354 QTC Calculation: 411 R Axis:   84 Text Interpretation: Normal sinus rhythm Incomplete right bundle branch block Cannot rule out Anterior infarct , age undetermined Abnormal ECG isolated flipped t wave in lead III ? lead placement Otherwise no significant change Confirmed by Melene Plan [phone]) on 05/18/2020 4:27:04 AM   Radiology DG Chest 2 View  Result Date:  05/17/2020 CLINICAL DATA:  34 year old male with chest pain. EXAM: CHEST - 2 VIEW COMPARISON:  Chest radiograph dated 07/12/2018 FINDINGS: The heart size and mediastinal contours are within normal limits. Both lungs are clear. The visualized skeletal structures are unremarkable. IMPRESSION: No active cardiopulmonary disease. Electronically Signed   By: Elgie Collard M.D.   On: 05/17/2020 23:48    Procedures Procedures (including critical care time)  Medications Ordered in ED Medications - No data to display  ED Course  I have reviewed the triage vital signs and the nursing notes.  Pertinent labs & imaging results that were available during my care of the patient were reviewed by  me and considered in my medical decision making (see chart for details).  34 yo male presenting to the ED with chest pain for several weeks, near-syncope episodes at home.    DDx includes arrhythmia vs ACS vs reflux vs dehydration (orthostasis) vs infection vs anemia vs covid vs other  Workup in the ED included 2 troponins, flat (5 ->3), BMP wnl, CBC with minor leukocytosis (13.6), hgb 13.1.  Xray per my interpretation with clear lung fields, no sign of PTX or PNA.  Covid negative.  ECG per my interpretation showing NSR w/ normal QTc, no evidence of brugada.  Isolated T wave inversion in lead T3 is likely normal variant for his age and habitus.  His telemetry here shows NSR without ectopy.  His vitals after an extended wait in our waiting room (10+ hours overnight) remained stable.  No fever to suggest infection or sepsis.   Based on this clinical workup, and his overall history, I had a lower suspicion for ACS, PTX, aortic dissection, or acute medical emergency.  He is PERC negative.  I have a low suspicion for PE at this time.  I advised staying hydrated at home.  I'll consult with [facility] to see if they can help him establish PCP care.  He and his wife agree with this plan and are eager to go home.  Okay for  discharge.  Isaiah Ball was evaluated in Emergency Department on 05/18/2020 for the symptoms described in the history of present illness. He was evaluated in the context of the global COVID-19 pandemic, which necessitated consideration that the patient might be at risk for infection with the SARS-CoV-2 virus that causes COVID-19. Institutional protocols and algorithms that pertain to the evaluation of patients at risk for COVID-19 are in a state of rapid change based on information released by regulatory bodies including the CDC and federal and state organizations. These policies and algorithms were followed during the patient's care in the ED.   Final Clinical Impression(s) / ED Diagnoses Final diagnoses:  Chest pain, unspecified type    Rx / DC Orders ED Discharge Orders    None       Terald Sleeper, MD 05/18/20 [phone]

## 2020-09-28 ENCOUNTER — Emergency Department (HOSPITAL_COMMUNITY)
Admission: EM | Admit: 2020-09-28 | Discharge: 2020-09-28 | Disposition: A | Discharge status: Home / Self Care | Payer: Self-pay | Attending: Emergency Medicine | Admitting: Emergency Medicine

## 2020-09-28 ENCOUNTER — Other Ambulatory Visit: Payer: Self-pay

## 2020-09-28 ENCOUNTER — Emergency Department (HOSPITAL_COMMUNITY)
Admission: EM | Admit: 2020-09-28 | Discharge: 2020-09-28 | Discharge status: Against Medical Advice | Payer: Medicaid Other

## 2020-09-28 ENCOUNTER — Emergency Department (HOSPITAL_BASED_OUTPATIENT_CLINIC_OR_DEPARTMENT_OTHER): Payer: Self-pay

## 2020-09-28 ENCOUNTER — Emergency Department (HOSPITAL_COMMUNITY): Payer: Self-pay

## 2020-09-28 ENCOUNTER — Encounter (HOSPITAL_COMMUNITY): Payer: Self-pay | Admitting: Emergency Medicine

## 2020-09-28 DIAGNOSIS — Z87891 Personal history of nicotine dependence: Secondary | ICD-10-CM | POA: Insufficient documentation

## 2020-09-28 DIAGNOSIS — R609 Edema, unspecified: Secondary | ICD-10-CM

## 2020-09-28 DIAGNOSIS — R0789 Other chest pain: Secondary | ICD-10-CM | POA: Insufficient documentation

## 2020-09-28 DIAGNOSIS — M7989 Other specified soft tissue disorders: Secondary | ICD-10-CM

## 2020-09-28 DIAGNOSIS — K047 Periapical abscess without sinus: Secondary | ICD-10-CM | POA: Insufficient documentation

## 2020-09-28 DIAGNOSIS — Z79899 Other long term (current) drug therapy: Secondary | ICD-10-CM | POA: Insufficient documentation

## 2020-09-28 DIAGNOSIS — M79662 Pain in left lower leg: Secondary | ICD-10-CM | POA: Insufficient documentation

## 2020-09-28 LAB — TROPONIN I (HIGH SENSITIVITY)
Troponin I (High Sensitivity): 5 ng/L (ref <18)
Troponin I (High Sensitivity): 3 ng/L (ref <18)

## 2020-09-28 LAB — BASIC METABOLIC PANEL
Anion gap: 8 (ref 5–15)
BUN: 13 mg/dL (ref 6–20)
CO2: 25 mmol/L (ref 22–32)
Calcium: 8.8 mg/dL — ABNORMAL LOW (ref 8.9–10.3)
Chloride: 105 mmol/L (ref 98–111)
Creatinine, Ser: 1.16 mg/dL (ref 0.61–1.24)
GFR, Estimated: >60 mL/min (ref >60)
Glucose, Bld: 94 mg/dL (ref 70–99)
Potassium: 3.8 mmol/L (ref 3.5–5.1)
Sodium: 138 mmol/L (ref 135–145)

## 2020-09-28 LAB — CBC
HCT: 38.3 % — ABNORMAL LOW (ref 39.0–52.0)
Hemoglobin: 13 g/dL (ref 13.0–17.0)
MCH: 30.5 pg (ref 26.0–34.0)
MCHC: 33.9 g/dL (ref 30.0–36.0)
MCV: 89.9 fL (ref 80.0–100.0)
Platelets: 255 10*3/uL (ref 150–400)
RBC: 4.26 MIL/uL (ref 4.22–5.81)
RDW: 13.8 % (ref 11.5–15.5)
WBC: 10.3 10*3/uL (ref 4.0–10.5)
nRBC: 0 % (ref 0.0–0.2)

## 2020-09-28 LAB — PROTIME-INR
INR: 0.9 (ref 0.8–1.2)
Prothrombin Time: 12.1 seconds (ref 11.4–15.2)

## 2020-09-28 MED ORDER — AMOXICILLIN-POT CLAVULANATE 875-125 MG PO TABS: 1.0000 | ORAL_TABLET | Freq: Two times a day (BID) | ORAL | 0 refills | Status: AC

## 2020-09-28 NOTE — Discharge Instructions (Addendum)
You have been seen and discharged from the emergency department.  Follow-up with listed providers for further evaluation. Take antibiotic as directed and any home medications as prescribed. If you have any worsening symptoms or further concerns or health please return to emergency department for further evaluation.

## 2020-09-28 NOTE — ED Provider Notes (Addendum)
[facility] EMERGENCY DEPARTMENT Provider Note   CSN: [id number] Arrival date & time: 09/28/20  0251     History Chief Complaint  Patient presents with  . Chest Pain    Isaiah Ball is a 34 y.o. male.  HPI   34 year old male with past medical history of gastric ulcer disease presents to the emergency department with concern for chest pain and left calf pain.  Patient states all day yesterday he was having intermittent midsternal sharp chest pain.  He states it was random, nonexertional, not reproducible.  There was no associated shortness of breath or cough.  The pain did not radiate.  The episodes were sporadic and ranged anywhere between 5 minutes to an hour but then self resolved.  He has had pain like this before, has been evaluated this facility prior with a negative cardiac work-up.  He has not followed up with a cardiologist as an outpatient or had a stress test.  He believes his mother had cardiac disease but no MI at a young age.  No history of DVT/PE or current risk factors.  He does complain of left calf discomfort and soreness for the past couple days without injury.  Denies any change in color of the left foot, no neuro symptoms, is ambulating at baseline.  Past Medical History:  Diagnosis Date  . History of stomach ulcers     There are no problems to display for this patient.   Past Surgical History:  Procedure Laterality Date  . ESOPHAGOGASTRODUODENOSCOPY         Family History  Problem Relation Age of Onset  . Heart failure Mother   . Anxiety disorder Mother     Social History   Tobacco Use  . Smoking status: Former Games developer  . Smokeless tobacco: Never Used  Vaping Use  . Vaping Use: Never used  Substance Use Topics  . Alcohol use: No  . Drug use: No    Home Medications Prior to Admission medications   Medication Sig Start Date End Date Taking? Authorizing Provider  cyclobenzaprine (FLEXERIL) 10 MG tablet Take 1 tablet (10 mg  total) by mouth 2 (two) times daily as needed for muscle spasms. 04/17/18   Terrilee Files, MD  ibuprofen (ADVIL,MOTRIN) 800 MG tablet Take 1 tablet (800 mg total) by mouth every 8 (eight) hours as needed. 04/17/18   Terrilee Files, MD  naproxen (NAPROSYN) 500 MG tablet Take 1 tablet (500 mg total) by mouth 2 (two) times daily with a meal. 12/18/19   Kendrick, Caitlyn S, PA-C  phenol (CHLORASEPTIC) 1.4 % LIQD Use as directed 1 spray in the mouth or throat as needed for throat irritation / pain. Patient not taking: Reported on 04/17/2018 11/12/17   Caccavale, Sophia, PA-C    Allergies    Tylenol [acetaminophen] and Zantac [ranitidine]  Review of Systems   Review of Systems  Constitutional: Negative for chills and fever.  HENT: Negative for congestion.   Eyes: Negative for visual disturbance.  Respiratory: Negative for shortness of breath.   Cardiovascular: Positive for chest pain. Negative for leg swelling.  Gastrointestinal: Negative for abdominal pain, diarrhea and vomiting.  Genitourinary: Negative for dysuria.  Musculoskeletal:       + Left calf pain  Skin: Negative for rash.  Neurological: Negative for syncope, light-headedness and headaches.    Physical Exam Updated Vital Signs BP 113/64   Pulse (!) 58   Temp 98.1 F (36.7 C) (Oral)   Resp 17  Ht 6\' 2"  (1.88 m)   Wt 115 kg   SpO2 98%   BMI 32.55 kg/m   Physical Exam Vitals and nursing note reviewed.  Constitutional:      Appearance: Normal appearance.  HENT:     Head: Normocephalic.     Mouth/Throat:     Mouth: Mucous membranes are moist.  Cardiovascular:     Rate and Rhythm: Normal rate.  Pulmonary:     Effort: Pulmonary effort is normal. No tachypnea or respiratory distress.     Breath sounds: Normal breath sounds.  Chest:     Chest wall: No deformity, tenderness or crepitus.  Abdominal:     Palpations: Abdomen is soft.     Tenderness: There is no abdominal tenderness. There is no guarding.   Musculoskeletal:     Right lower leg: No edema.     Left lower leg: Tenderness present. No edema.  Skin:    General: Skin is warm.  Neurological:     Mental Status: He is alert and oriented to person, place, and time. Mental status is at baseline.  Psychiatric:        Mood and Affect: Mood normal.     ED Results / Procedures / Treatments   Labs (all labs ordered are listed, but only abnormal results are displayed) Labs Reviewed  BASIC METABOLIC PANEL - Abnormal; Notable for the following components:      Result Value   Calcium 8.8 (*)    All other components within normal limits  CBC - Abnormal; Notable for the following components:   HCT 38.3 (*)    All other components within normal limits  PROTIME-INR  TROPONIN I (HIGH SENSITIVITY)  TROPONIN I (HIGH SENSITIVITY)    EKG EKG Interpretation  Date/Time:  Tuesday September 28 2020 03:00:21 EST Ventricular Rate:  76 PR Interval:  154 QRS Duration: 82 QT Interval:  374 QTC Calculation: 420 R Axis:   84 Text Interpretation: Normal sinus rhythm Possible Inferior infarct , age undetermined Possible Anterior infarct , age undetermined Abnormal ECG Isolated flipped T wave in III and aVF, present on previous Confirmed by Coralee PesaHorton, Ibrahim Mcpheeters [phone]) on 09/28/2020 8:22:16 AM   Radiology DG Chest 2 View  Result Date: 09/28/2020 CLINICAL DATA:  Chest pain EXAM: CHEST - 2 VIEW COMPARISON:  05/17/2020 FINDINGS: The heart size and mediastinal contours are within normal limits. Both lungs are clear. The visualized skeletal structures are unremarkable. IMPRESSION: Normal study. Electronically Signed   By: Charlett NoseKevin  Dover M.D.   On: 09/28/2020 03:25   VAS US LOWER EXTREMITY VENOUS (DVT) ([facility] and [facility] 7a-7p)  Result Date: 09/28/2020  Lower Venous DVT Study Indications: Swelling, and Edema.  Comparison Study: no prior Performing Technologist: Blanch MediaMegan Riddle RVS  Examination Guidelines: A complete evaluation includes B-mode imaging, spectral Doppler,  color Doppler, and power Doppler as needed of all accessible portions of each vessel. Bilateral testing is considered an integral part of a complete examination. Limited examinations for reoccurring indications may be performed as noted. The reflux portion of the exam is performed with the patient in reverse Trendelenburg.  +-----+---------------+---------+-----------+----------+--------------+ RIGHTCompressibilityPhasicitySpontaneityPropertiesThrombus Aging +-----+---------------+---------+-----------+----------+--------------+ CFV  Full           Yes      Yes                                 +-----+---------------+---------+-----------+----------+--------------+   +---------+---------------+---------+-----------+----------+-------------------+ LEFT     CompressibilityPhasicitySpontaneityPropertiesThrombus Aging      +---------+---------------+---------+-----------+----------+-------------------+  CFV      Full           Yes      Yes                                      +---------+---------------+---------+-----------+----------+-------------------+ SFJ      Full                                                             +---------+---------------+---------+-----------+----------+-------------------+ FV Prox  Full                                                             +---------+---------------+---------+-----------+----------+-------------------+ FV Mid   Full                                                             +---------+---------------+---------+-----------+----------+-------------------+ FV DistalFull                                                             +---------+---------------+---------+-----------+----------+-------------------+ PFV      Full                                                             +---------+---------------+---------+-----------+----------+-------------------+ POP      Full           Yes      Yes                                       +---------+---------------+---------+-----------+----------+-------------------+ PTV      Full                                                             +---------+---------------+---------+-----------+----------+-------------------+ PERO                                                  Not well visualized +---------+---------------+---------+-----------+----------+-------------------+     Summary: RIGHT: - No evidence of common femoral vein obstruction.  LEFT: - There is no evidence of deep  vein thrombosis in the lower extremity.  - No cystic structure found in the popliteal fossa.  *See table(s) above for measurements and observations.    Preliminary     Procedures Procedures (including critical care time)  Medications Ordered in ED Medications - No data to display  ED Course  I have reviewed the triage vital signs and the nursing notes.  Pertinent labs & imaging results that were available during my care of the patient were reviewed by me and considered in my medical decision making (see chart for details).  Clinical Course as of 09/28/20 1049  Tue Sep 28, 2020  1047 EKG has isolated flipped T waves in 3 and aVF which have been present before, no other acute ischemic changes.  Vitals are stable, patient is currently chest pain-free.  Blood work is reassuring, 2 - troponins.  He was complaining of left calf discomfort, ultrasound is negative for DVT.  Very low suspicion for PE given that he is PERC negative.  He has only a heart score of 0-1 which is suitable for outpatient follow-up and testing. [KH]    Clinical Course User Index [KH] Icesis Renn, Clabe Seal, DO   MDM Rules/Calculators/A&P         HEART Score: 1                 -year-old male presents the emergency department for chest pain and left calf pain.  His pain pattern does not seem consistent with ACS.  He is currently chest pain-free.  His work-up is reassuring without any acute  changes, 2 - troponins, he is a very low heart score.  Ultrasound shows no DVT.  Vitals are stable.  No suspicion for PE at this time.  Patient will be referred to outpatient services for further testing with strict return to ED instructions.  Patient understands discharge and agrees.  On DC patient asked me to look at his lower tooth. He states he has had a problem with it and pain that is getting worse with some swelling of the gum line. He appears to have a dental carries vs abscess, will prescribe antibiotic as patient has no outpatient care currently until he can get to the free clinic for further evaluation.  Final Clinical Impression(s) / ED Diagnoses Final diagnoses:  None    Rx / DC Orders ED Discharge Orders    None       Rozelle Logan, DO 09/28/20 1049    Newt Levingston, Clabe Seal, DO 09/28/20 1058

## 2020-09-28 NOTE — ED Notes (Signed)
 charge RN called to have patient discharged due to patients arrival at [facility] ED.

## 2020-09-28 NOTE — ED Notes (Signed)
Patient transported to Ultrasound via stretcher 

## 2020-09-28 NOTE — ED Notes (Signed)
Help get patient into a gown on the monitor patient is resting with call bell in reach 

## 2020-09-28 NOTE — Progress Notes (Signed)
Lower extremity venous has been completed.   Preliminary results in CV Proc.   Blanch Media 09/28/2020 10:29 AM

## 2020-09-28 NOTE — ED Triage Notes (Signed)
Patient reports central chest pain with SOB this evening , no emesis or diaphoresis , denies cough or fever . 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

## 2021-03-10 IMAGING — CR DG CHEST 2V
2 series · 2 of 2 positions shown · non-contrast
Comparison: Chest radiograph dated 07/12/2018

CLINICAL DATA: 34-year-old male with chest pain.

EXAM:
CHEST - 2 VIEW

[chest pa]
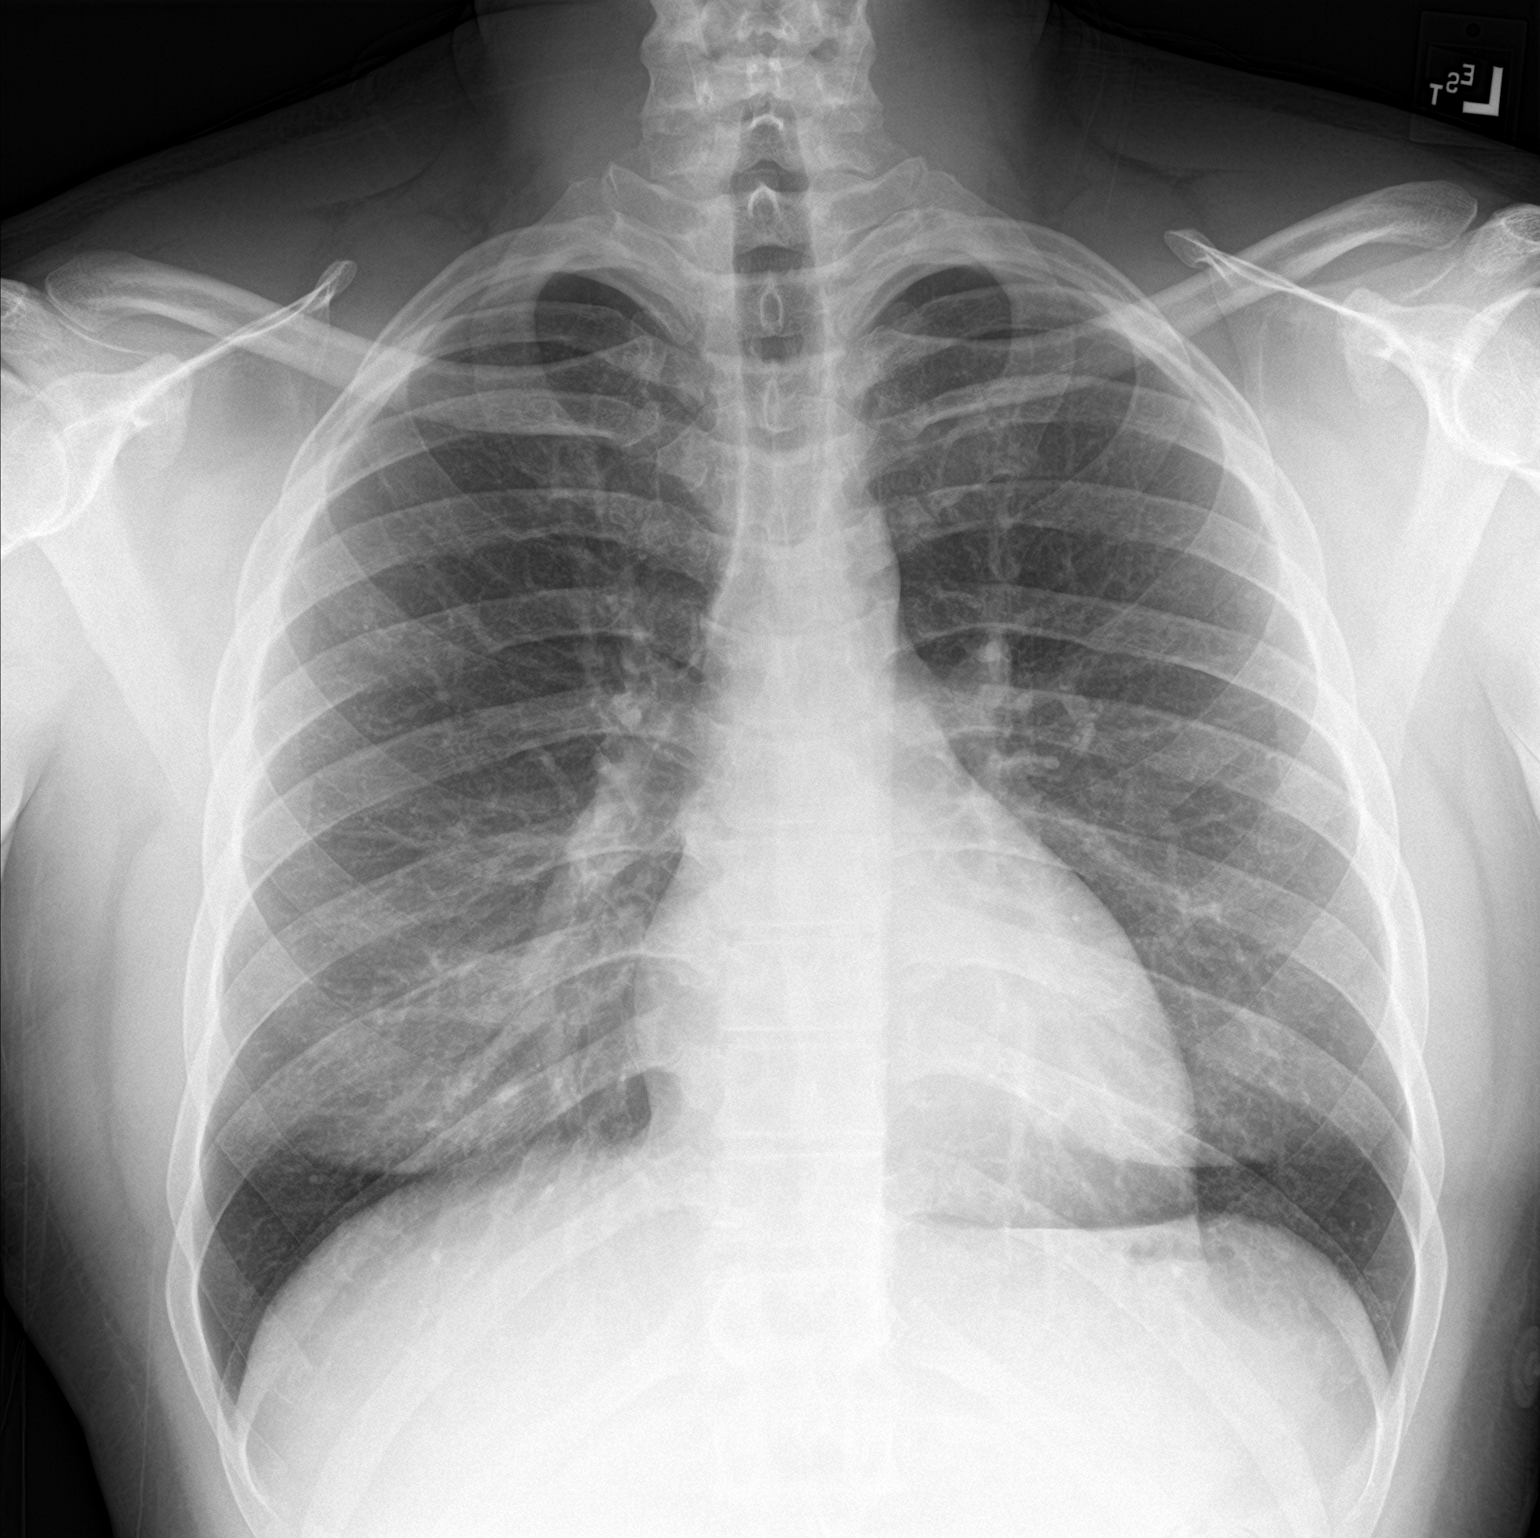

[chest lat]
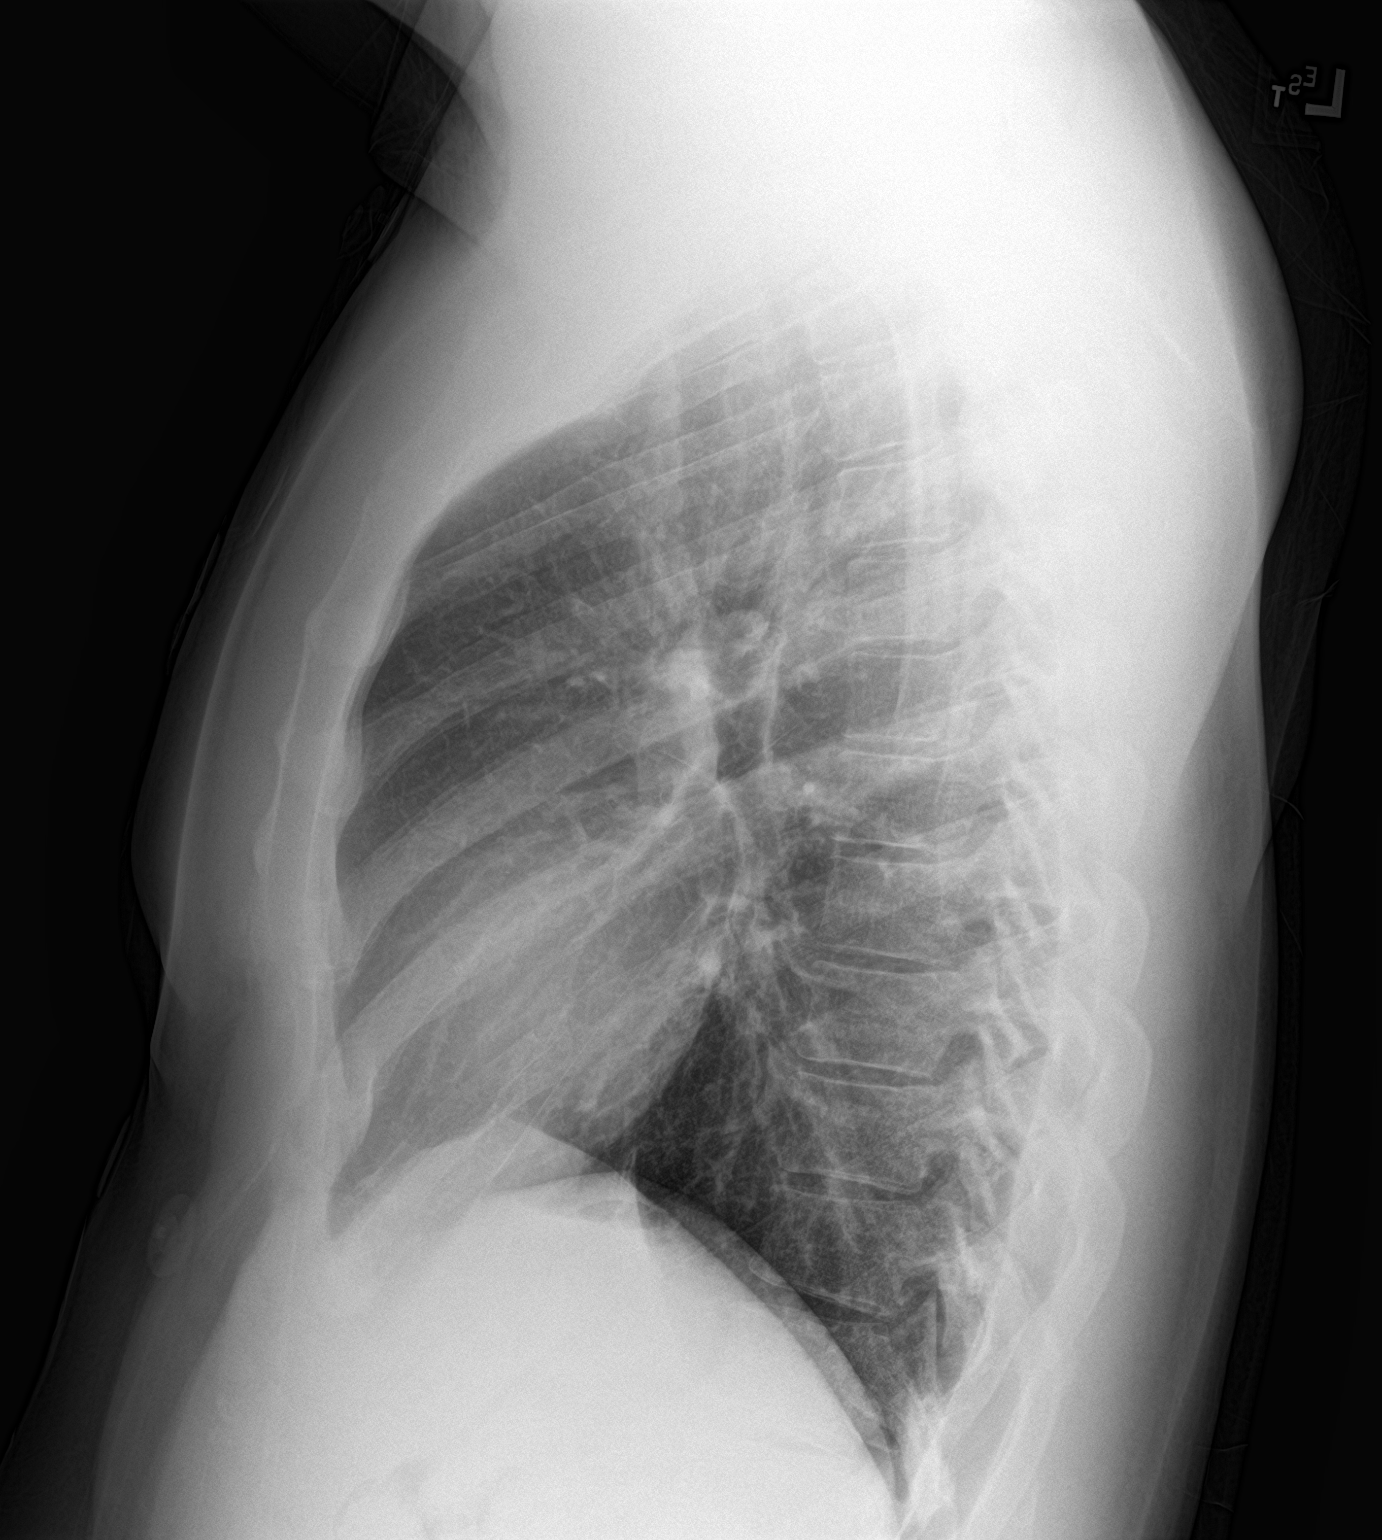

[2 of 2 positions shown; findings below may reference images not displayed]

FINDINGS: The heart size and mediastinal contours are within normal limits.
Both lungs are clear. The visualized skeletal structures are
unremarkable.
IMPRESSION: No active cardiopulmonary disease.

## 2021-07-22 IMAGING — CR DG CHEST 2V
2 series · 2 of 2 positions shown · non-contrast
Comparison: 05/17/2020

CLINICAL DATA: Chest pain

EXAM:
CHEST - 2 VIEW

[chest pa]
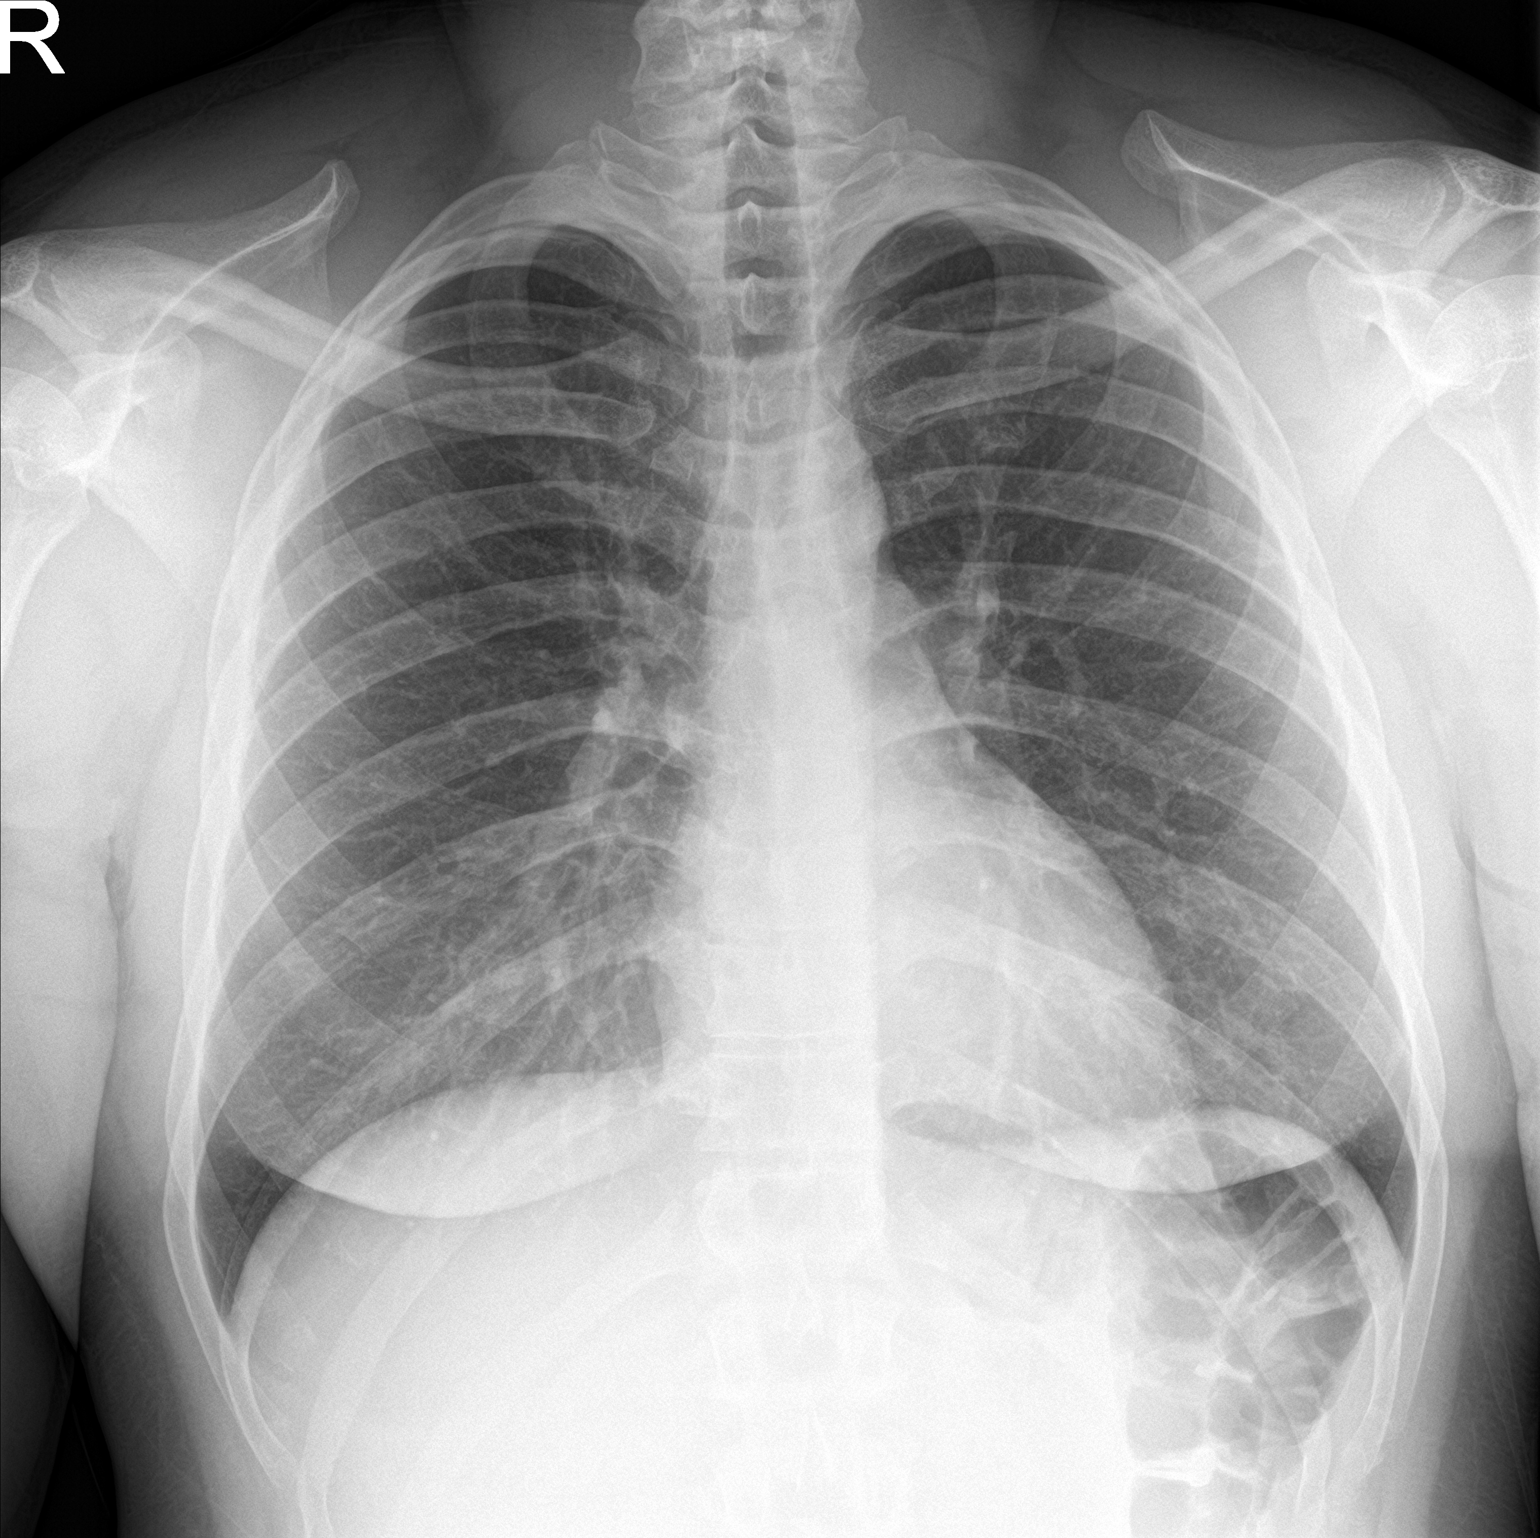

[chest lat]
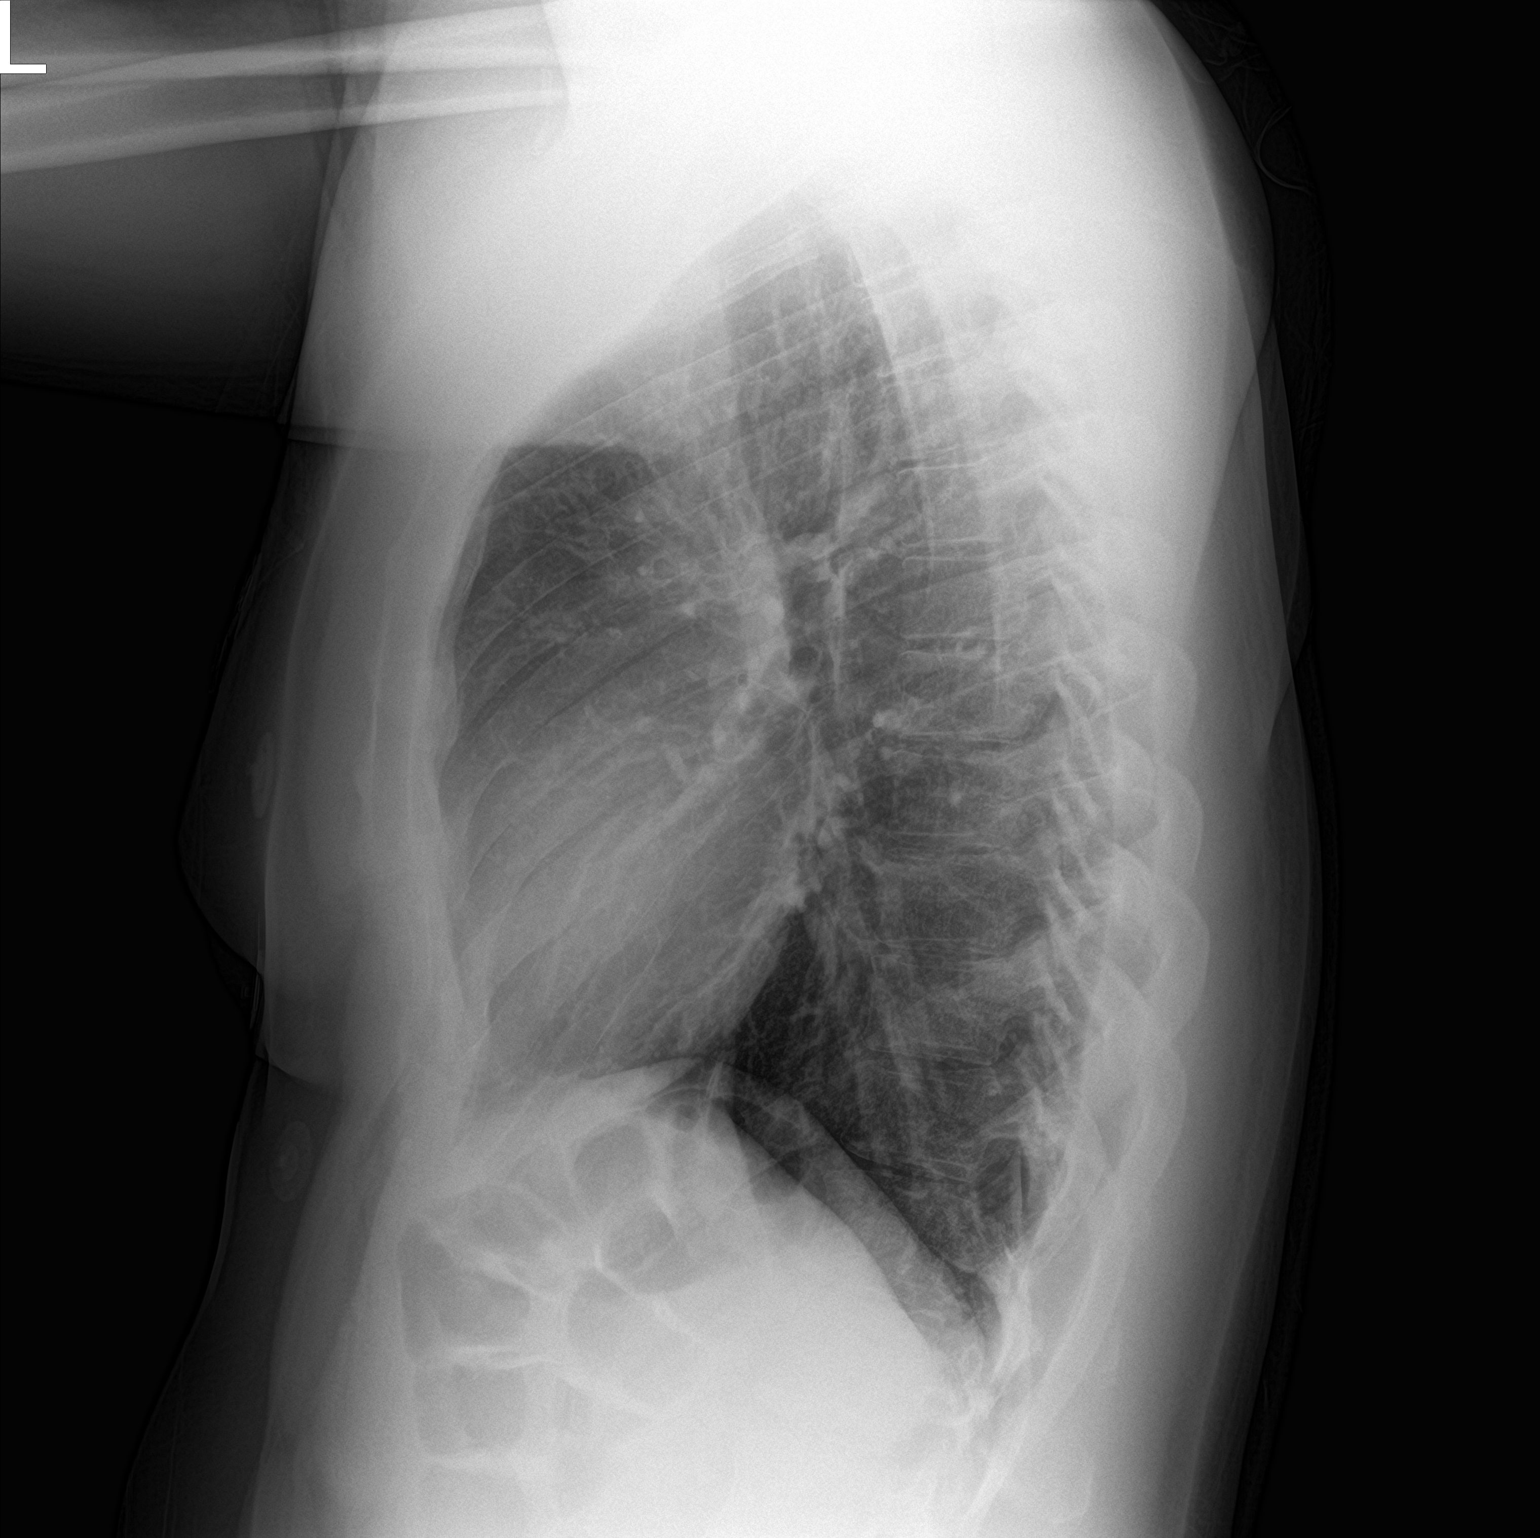

[2 of 2 positions shown; findings below may reference images not displayed]

FINDINGS: The heart size and mediastinal contours are within normal limits.
Both lungs are clear. The visualized skeletal structures are
unremarkable.
IMPRESSION: Normal study.
